# Patient Record
Sex: Female | Born: 1965 | Race: Black or African American | Hispanic: No | Marital: Single | Smoking: Never smoker
Health system: Southern US, Community
[De-identification: ages and names within clinical notes are randomized; demographics above are authoritative.]

## PROBLEM LIST (undated history)

## (undated) DIAGNOSIS — E119 Type 2 diabetes mellitus without complications: Secondary | ICD-10-CM

## (undated) DIAGNOSIS — I10 Essential (primary) hypertension: Secondary | ICD-10-CM

## (undated) HISTORY — PX: ABDOMINAL HYSTERECTOMY: SHX81

---

## 2013-11-18 ENCOUNTER — Encounter (HOSPITAL_COMMUNITY): Payer: Self-pay | Admitting: Emergency Medicine

## 2013-11-18 ENCOUNTER — Emergency Department (HOSPITAL_COMMUNITY): Payer: No Typology Code available for payment source

## 2013-11-18 ENCOUNTER — Emergency Department (HOSPITAL_COMMUNITY)
Admission: EM | Admit: 2013-11-18 | Discharge: 2013-11-18 | Disposition: A | Discharge status: Home / Self Care | Payer: No Typology Code available for payment source | Attending: Emergency Medicine | Admitting: Emergency Medicine

## 2013-11-18 DIAGNOSIS — Z90710 Acquired absence of both cervix and uterus: Secondary | ICD-10-CM | POA: Insufficient documentation

## 2013-11-18 DIAGNOSIS — R110 Nausea: Secondary | ICD-10-CM | POA: Insufficient documentation

## 2013-11-18 DIAGNOSIS — E119 Type 2 diabetes mellitus without complications: Secondary | ICD-10-CM | POA: Insufficient documentation

## 2013-11-18 DIAGNOSIS — R109 Unspecified abdominal pain: Secondary | ICD-10-CM | POA: Insufficient documentation

## 2013-11-18 DIAGNOSIS — R638 Other symptoms and signs concerning food and fluid intake: Secondary | ICD-10-CM | POA: Insufficient documentation

## 2013-11-18 DIAGNOSIS — I10 Essential (primary) hypertension: Secondary | ICD-10-CM | POA: Insufficient documentation

## 2013-11-18 HISTORY — DX: Essential (primary) hypertension: I10

## 2013-11-18 HISTORY — DX: Type 2 diabetes mellitus without complications: E11.9

## 2013-11-18 LAB — COMPREHENSIVE METABOLIC PANEL WITH GFR
ALT: 21 U/L (ref 0–35)
AST: 15 U/L (ref 0–37)
Albumin: 3.7 g/dL (ref 3.5–5.2)
Alkaline Phosphatase: 54 U/L (ref 39–117)
BUN: 12 mg/dL (ref 6–23)
CO2: 23 meq/L (ref 19–32)
Calcium: 9.7 mg/dL (ref 8.4–10.5)
Chloride: 106 meq/L (ref 96–112)
Creatinine, Ser: 0.74 mg/dL (ref 0.50–1.10)
GFR calc Af Amer: >90 mL/min (ref >90)
GFR calc non Af Amer: >90 mL/min (ref >90)
Glucose, Bld: 91 mg/dL (ref 70–99)
Potassium: 3.6 meq/L — ABNORMAL LOW (ref 3.7–5.3)
Sodium: 142 meq/L (ref 137–147)
Total Bilirubin: 0.4 mg/dL (ref 0.3–1.2)
Total Protein: 8.1 g/dL (ref 6.0–8.3)

## 2013-11-18 LAB — URINALYSIS, ROUTINE W REFLEX MICROSCOPIC
BILIRUBIN URINE: NEGATIVE
Glucose, UA: NEGATIVE mg/dL
Hgb urine dipstick: NEGATIVE
KETONES UR: NEGATIVE mg/dL
Leukocytes, UA: NEGATIVE
NITRITE: NEGATIVE
Protein, ur: NEGATIVE mg/dL
Urobilinogen, UA: 0.2 mg/dL (ref 0.0–1.0)
pH: 6 (ref 5.0–8.0)

## 2013-11-18 LAB — CBC WITH DIFFERENTIAL/PLATELET
Basophils Absolute: 0 10*3/uL (ref 0.0–0.1)
Basophils Relative: 0 % (ref 0–1)
EOS ABS: 0.1 10*3/uL (ref 0.0–0.7)
EOS PCT: 1 % (ref 0–5)
HCT: 33.2 % — ABNORMAL LOW (ref 36.0–46.0)
HEMOGLOBIN: 10.5 g/dL — AB (ref 12.0–15.0)
Lymphocytes Relative: 29 % (ref 12–46)
Lymphs Abs: 2.1 10*3/uL (ref 0.7–4.0)
MCH: 26.3 pg (ref 26.0–34.0)
MCHC: 31.6 g/dL (ref 30.0–36.0)
MCV: 83.2 fL (ref 78.0–100.0)
MONO ABS: 0.6 10*3/uL (ref 0.1–1.0)
MONOS PCT: 9 % (ref 3–12)
NEUTROS PCT: 61 % (ref 43–77)
Neutro Abs: 4.3 10*3/uL (ref 1.7–7.7)
Platelets: 316 10*3/uL (ref 150–400)
RBC: 3.99 MIL/uL (ref 3.87–5.11)
RDW: 14.2 % (ref 11.5–15.5)
WBC: 7.1 10*3/uL (ref 4.0–10.5)

## 2013-11-18 LAB — LIPASE, BLOOD: LIPASE: 34 U/L (ref 11–59)

## 2013-11-18 MED ORDER — SODIUM CHLORIDE 0.9 % IV BOLUS (SEPSIS)
1000.0000 mL | Freq: Once | INTRAVENOUS | Status: AC
  Administered 2013-11-18: 1000 mL via INTRAVENOUS

## 2013-11-18 MED ORDER — ONDANSETRON HCL 4 MG PO TABS: 4.0000 mg | ORAL_TABLET | Freq: Four times a day (QID) | ORAL | Status: AC

## 2013-11-18 MED ORDER — KETOROLAC TROMETHAMINE 30 MG/ML IJ SOLN
30.0000 mg | Freq: Once | INTRAMUSCULAR | Status: AC
  Administered 2013-11-18: 30 mg via INTRAVENOUS
  Filled 2013-11-18: qty 1

## 2013-11-18 MED ORDER — ONDANSETRON HCL 4 MG/2ML IJ SOLN
4.0000 mg | Freq: Once | INTRAMUSCULAR | Status: AC
  Administered 2013-11-18: 4 mg via INTRAVENOUS
  Filled 2013-11-18: qty 2

## 2013-11-18 NOTE — ED Notes (Signed)
Pt c/o N &V since hysterectomy last month. Pt alert and oriented X3.

## 2013-11-18 NOTE — Discharge Instructions (Signed)
Nausea, Adult Nausea is the feeling that you have an upset stomach or have to vomit. Nausea by itself is not likely a serious concern, but it may be an early sign of more serious medical problems. As nausea gets worse, it can lead to vomiting. If vomiting develops, there is the risk of dehydration.  CAUSES   Viral infections.  Food poisoning.  Medicines.  Pregnancy.  Motion sickness.  Migraine headaches.  Emotional distress.  Severe pain from any source.  Alcohol intoxication. HOME CARE INSTRUCTIONS  Get plenty of rest.  Ask your caregiver about specific rehydration instructions.  Eat small amounts of food and sip liquids more often.  Take all medicines as told by your caregiver. SEEK MEDICAL CARE IF:  You have not improved after 2 days, or you get worse.  You have a headache. SEEK IMMEDIATE MEDICAL CARE IF:   You have a fever.  You faint.  You keep vomiting or have blood in your vomit.  You are extremely weak or dehydrated.  You have dark or bloody stools.  You have severe chest or abdominal pain. MAKE SURE YOU:  Understand these instructions.  Will watch your condition.  Will get help right away if you are not doing well or get worse. Document Released: 10/05/2004 Document Revised: 05/22/2012 Document Reviewed: 05/10/2011 [employer] Patient Information 2014 [employer].  

## 2013-11-18 NOTE — ED Provider Notes (Signed)
CSN: [id number]     Arrival date & time 11/18/13  1413 History  This chart was scribed for Kathy Creasehristopher J. Cheick Suhr, MD by Quintella ReichertMatthew Fitzgerald, ED scribe.  This patient was seen in room APA19/APA19 and the patient's care was started at 4:20 PM.   Chief Complaint  Patient presents with  . Nausea    The history is provided by the patient. No language interpreter was used.    HPI Comments: Kathy PeersDeborah Fitzgerald is a 48 y.o. female with h/o DM and HTN with recent h/o abdominal hysterectomy (11/04/13) who presents to the Emergency Department complaining of nausea and appetite loss that began 2 days ago.  Pt reports she has been at home recovering since her hysterectomy and 2 days ago she became nauseated and lost her appetite.  She states she is unable to eat anything without feeling nauseated and "like I'm gagging."  She denies vomiting or diarrhea.  She does not have anti-emetics at home.  She also complains of mild pain at the incision site but she has taken herself off of her post-surgical pain medications.  She denies any other abdominal pain.       Past Medical History  Diagnosis Date  . Hypertension   . Diabetes mellitus without complication     Past Surgical History  Procedure Laterality Date  . Abdominal hysterectomy      No family history on file.   History  Substance Use Topics  . Smoking status: Never Smoker   . Smokeless tobacco: Not on file  . Alcohol Use: No    OB History   Grav Para Term Preterm Abortions TAB SAB Ect Mult Living                   Review of Systems  Constitutional: Positive for appetite change.  Gastrointestinal: Positive for nausea and abdominal pain (at incision site). Negative for vomiting and diarrhea.  All other systems reviewed and are negative.      Allergies  Review of patient's allergies indicates no known allergies.  Home Medications  No current outpatient prescriptions on file.  BP 172/74  Pulse 68  Temp(Src) 98.4 F (36.9 C)  (Oral)  Resp 18  Ht 5\' 3"  (1.6 m)  Wt 161 lb (73.029 kg)  BMI 28.53 kg/m2  SpO2 99%  Physical Exam  Nursing note and vitals reviewed. Constitutional: She is oriented to person, place, and time. She appears well-developed and well-nourished. No distress.  HENT:  Head: Normocephalic and atraumatic.  Right Ear: Hearing normal.  Left Ear: Hearing normal.  Nose: Nose normal.  Mouth/Throat: Oropharynx is clear and moist and mucous membranes are normal.  Eyes: Conjunctivae and EOM are normal. Pupils are equal, round, and reactive to light.  Neck: Normal range of motion. Neck supple.  Cardiovascular: Regular rhythm, S1 normal and S2 normal.  Exam reveals no gallop and no friction rub.   No murmur heard. Pulmonary/Chest: Effort normal and breath sounds normal. No respiratory distress. She exhibits no tenderness.  Abdominal: Soft. Normal appearance and bowel sounds are normal. There is no hepatosplenomegaly. There is no tenderness. There is no rebound, no guarding, no tenderness at McBurney's point and negative Murphy's sign. No hernia.  Well-healing incision at lower pelvic region.. No redness, drainage or tenderness.  Musculoskeletal: Normal range of motion.  Neurological: She is alert and oriented to person, place, and time. She has normal strength. No cranial nerve deficit or sensory deficit. Coordination normal. GCS eye subscore is 4. GCS verbal subscore  is 5. GCS motor subscore is 6.  Skin: Skin is warm, dry and intact. No rash noted. No cyanosis.  Psychiatric: She has a normal mood and affect. Her speech is normal and behavior is normal. Thought content normal.    ED Course  Procedures (including critical care time)  DIAGNOSTIC STUDIES: Oxygen Saturation is 99% on room air, normal by my interpretation.    COORDINATION OF CARE: 4:23 PM-Discussed treatment plan which includes IV fluids, anti-emetics, and labs with pt at bedside and pt agreed to plan.     Labs Review Labs Reviewed  - No data to display  Imaging Review No results found.   EKG Interpretation None      MDM   Final diagnoses:  None    Patient presents to the ER for evaluation of nausea. She has stopped eating because of nausea, has not had any vomiting she denies diarrhea. The patient underwent abdominal hysterectomy 2 weeks ago. She reports that the pain has progressively improved, now not taking any further pain medication. Surgical site appears normal, absolutely no concern for infection or postop complications. Abdominal exam was benign, no signs of peritonitis. 2 abdominal x-ray did not show any evidence of obstruction or constipation. Chest x-ray clear. Vital signs unremarkable other than hypertension. She does have a history of hypertension. Lab work was also unremarkable. Patient was hydrated here in the ER and will be treated with anti-emetics, followup as needed.    I personally performed the services described in this documentation, which was scribed in my presence. The recorded information has been reviewed and is accurate.   Kathy Crease, MD 11/18/13 [phone]

## 2013-11-18 NOTE — ED Notes (Signed)
Pt c/o nausea, chills since Sunday, denies any vomiting or diarrhea, does complain of pain at the incision, but states that the pain has gotten better and it is the same type of pain that she has been having since her hysterectomy at the end of feb.  states that she had a hysterectomy on 11/08/2013

## 2014-08-17 IMAGING — CR DG ABDOMEN ACUTE W/ 1V CHEST
3 series · 3 of 3 positions shown · non-contrast
Comparison: none

[view not recorded (1 of 3)]
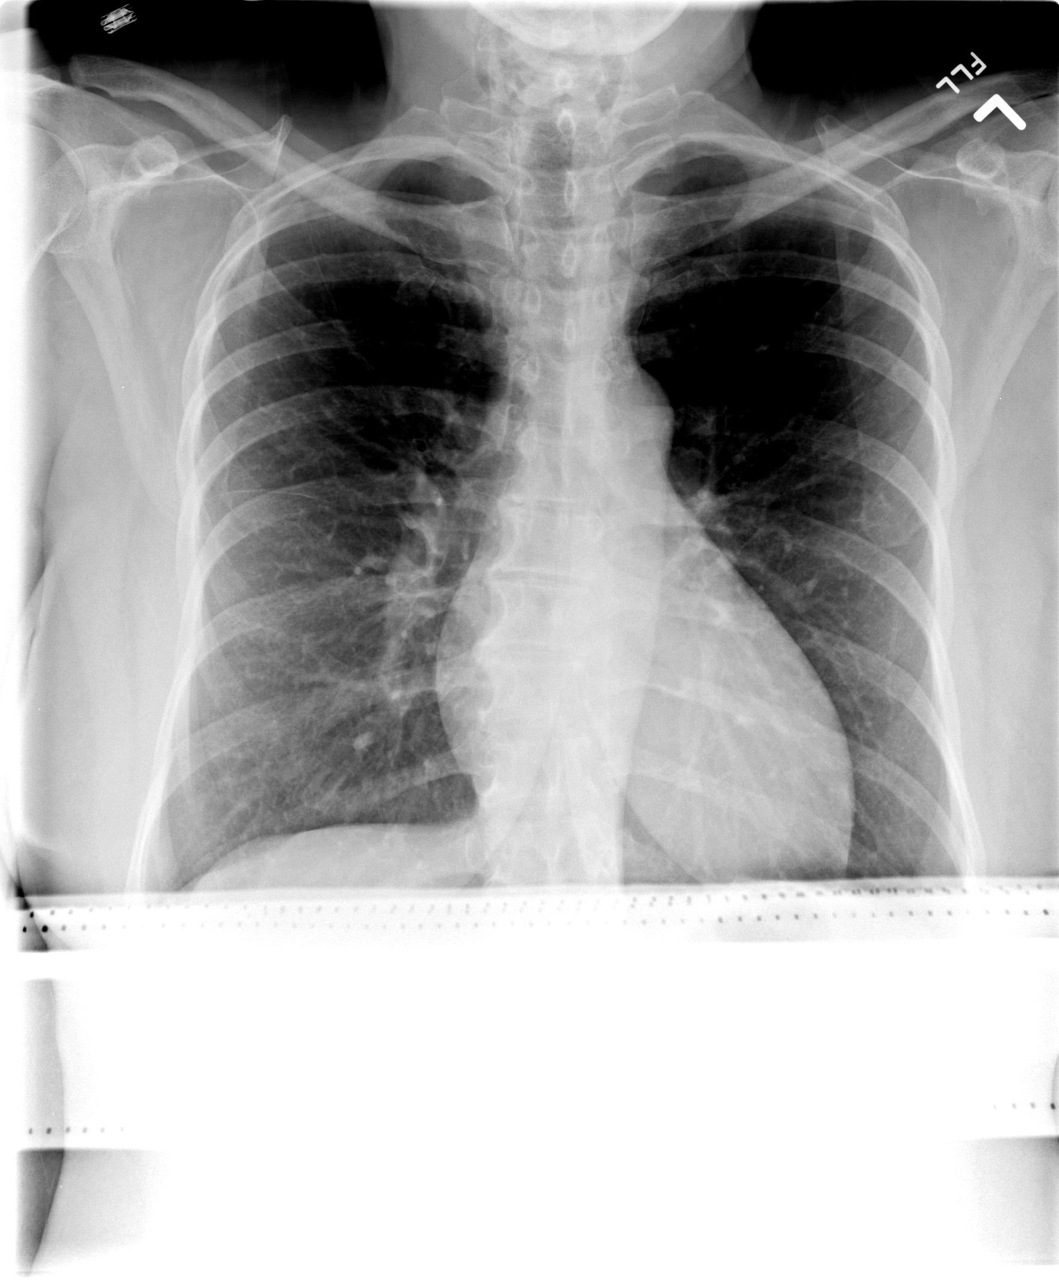

[view not recorded (2 of 3)]
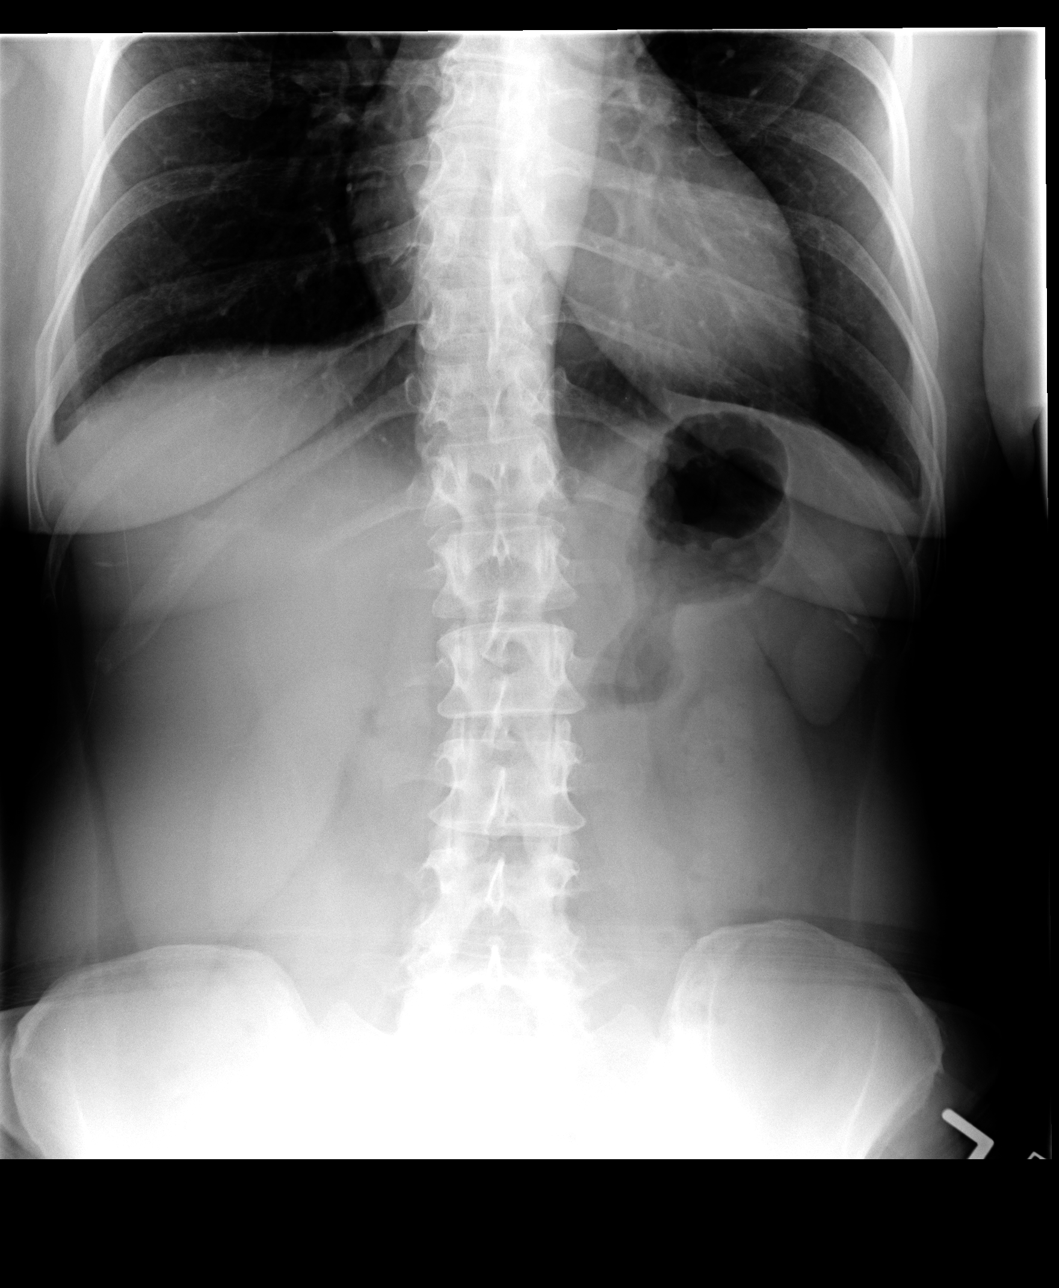

[view not recorded (3 of 3)]
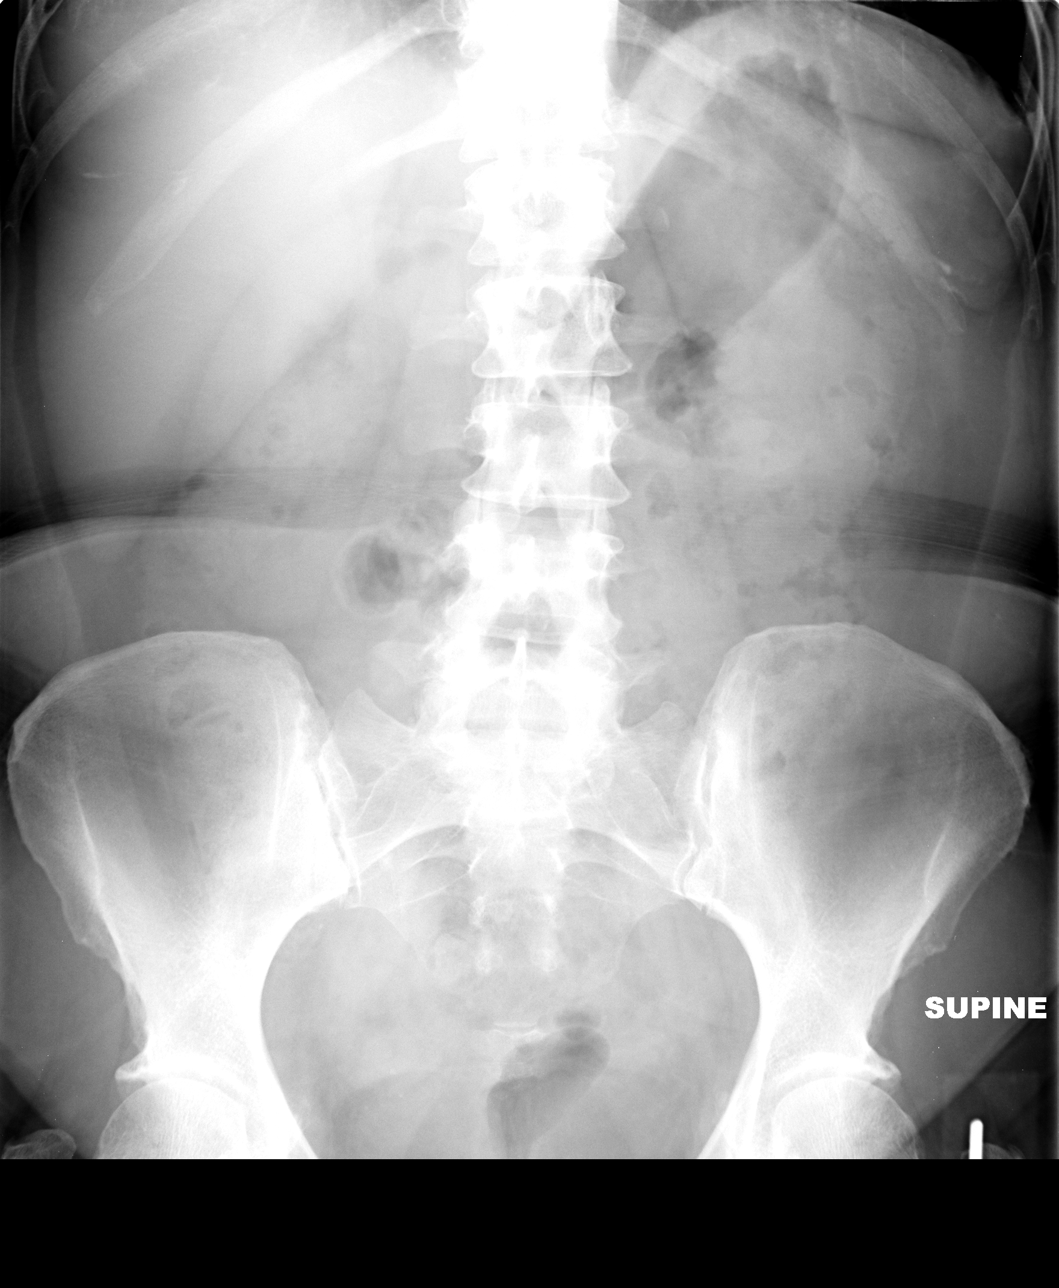

[3 of 3 positions shown; findings below may reference images not displayed]

CLINICAL DATA
Hysterectomy in October 2013, now with nausea and abdominal pain.

EXAM
ACUTE ABDOMEN SERIES (ABDOMEN 2 VIEW & CHEST 1 VIEW)

COMPARISON
None.

FINDINGS
The lungs are well-expanded. There is no focal infiltrate. There is
no pleural effusion. The cardiac silhouette is normal in size. The
pulmonary vascularity is not engorged. Within the abdomen the bowel
gas pattern is within the limits of normal. A moderate stool burden
is present. There is no evidence of obstruction. No abnormal soft
tissue calcifications are demonstrated. There is a phlebolith in the
right aspect of the pelvis. The bony structures exhibit no acute
abnormalities.

IMPRESSION
1. No acute intra-abdominal abnormality is demonstrated.
2. There is no evidence of active cardiopulmonary disease.

SIGNATURE

## 2017-12-10 DEATH — deceased
